# Patient Record
Sex: Male | Born: 1950 | Hispanic: No | Marital: Married | Smoking: Never smoker
Health system: Southern US, Community
[De-identification: ages and names within clinical notes are randomized; demographics above are authoritative.]

## PROBLEM LIST (undated history)

## (undated) DIAGNOSIS — E079 Disorder of thyroid, unspecified: Secondary | ICD-10-CM

## (undated) DIAGNOSIS — E119 Type 2 diabetes mellitus without complications: Secondary | ICD-10-CM

## (undated) DIAGNOSIS — I10 Essential (primary) hypertension: Secondary | ICD-10-CM

## (undated) HISTORY — PX: CHOLECYSTECTOMY: SHX55

---

## 2002-07-05 ENCOUNTER — Encounter: Payer: Self-pay | Admitting: Family Medicine

## 2002-07-05 ENCOUNTER — Encounter: Admission: RE | Admit: 2002-07-05 | Discharge: 2002-07-05 | Payer: Self-pay | Admitting: Family Medicine

## 2014-08-22 ENCOUNTER — Emergency Department (HOSPITAL_BASED_OUTPATIENT_CLINIC_OR_DEPARTMENT_OTHER)
Admission: EM | Admit: 2014-08-22 | Discharge: 2014-08-23 | Disposition: A | Discharge status: Home / Self Care | Payer: BC Managed Care – PPO | Attending: Emergency Medicine | Admitting: Emergency Medicine

## 2014-08-22 ENCOUNTER — Emergency Department (HOSPITAL_BASED_OUTPATIENT_CLINIC_OR_DEPARTMENT_OTHER): Payer: BC Managed Care – PPO

## 2014-08-22 ENCOUNTER — Encounter (HOSPITAL_BASED_OUTPATIENT_CLINIC_OR_DEPARTMENT_OTHER): Payer: Self-pay

## 2014-08-22 DIAGNOSIS — R0602 Shortness of breath: Secondary | ICD-10-CM

## 2014-08-22 DIAGNOSIS — Z791 Long term (current) use of non-steroidal anti-inflammatories (NSAID): Secondary | ICD-10-CM | POA: Diagnosis not present

## 2014-08-22 DIAGNOSIS — Z79899 Other long term (current) drug therapy: Secondary | ICD-10-CM | POA: Insufficient documentation

## 2014-08-22 DIAGNOSIS — E079 Disorder of thyroid, unspecified: Secondary | ICD-10-CM | POA: Insufficient documentation

## 2014-08-22 DIAGNOSIS — I10 Essential (primary) hypertension: Secondary | ICD-10-CM | POA: Insufficient documentation

## 2014-08-22 DIAGNOSIS — J209 Acute bronchitis, unspecified: Secondary | ICD-10-CM | POA: Diagnosis not present

## 2014-08-22 HISTORY — DX: Disorder of thyroid, unspecified: E07.9

## 2014-08-22 HISTORY — DX: Essential (primary) hypertension: I10

## 2014-08-22 LAB — CBC WITH DIFFERENTIAL/PLATELET
BASOS ABS: 0 10*3/uL (ref 0.0–0.1)
Basophils Relative: 1 % (ref 0–1)
Eosinophils Absolute: 0.4 10*3/uL (ref 0.0–0.7)
Eosinophils Relative: 6 % — ABNORMAL HIGH (ref 0–5)
HCT: 42.2 % (ref 39.0–52.0)
Hemoglobin: 13.9 g/dL (ref 13.0–17.0)
LYMPHS PCT: 28 % (ref 12–46)
Lymphs Abs: 1.6 10*3/uL (ref 0.7–4.0)
MCH: 32.3 pg (ref 26.0–34.0)
MCHC: 32.9 g/dL (ref 30.0–36.0)
MCV: 97.9 fL (ref 78.0–100.0)
Monocytes Absolute: 0.5 10*3/uL (ref 0.1–1.0)
Monocytes Relative: 9 % (ref 3–12)
NEUTROS ABS: 3.2 10*3/uL (ref 1.7–7.7)
NEUTROS PCT: 56 % (ref 43–77)
PLATELETS: 124 10*3/uL — AB (ref 150–400)
RBC: 4.31 MIL/uL (ref 4.22–5.81)
RDW: 13.3 % (ref 11.5–15.5)
WBC: 5.7 10*3/uL (ref 4.0–10.5)

## 2014-08-22 MED ORDER — ALBUTEROL SULFATE (2.5 MG/3ML) 0.083% IN NEBU
2.5000 mg | INHALATION_SOLUTION | Freq: Once | RESPIRATORY_TRACT | Status: AC
  Administered 2014-08-22: 2.5 mg via RESPIRATORY_TRACT
  Filled 2014-08-22: qty 3

## 2014-08-22 MED ORDER — IPRATROPIUM-ALBUTEROL 0.5-2.5 (3) MG/3ML IN SOLN
3.0000 mL | Freq: Once | RESPIRATORY_TRACT | Status: AC
  Administered 2014-08-22: 3 mL via RESPIRATORY_TRACT
  Filled 2014-08-22: qty 3

## 2014-08-22 NOTE — ED Provider Notes (Signed)
CSN: [id number]     Arrival date & time 08/22/14  2214 History  This chart was scribed for Geoffery Lyonsouglas Areyana Leoni, MD by Ronney LionSuzanne Le, ED Scribe. This patient was seen in room MH01/MH01 and the patient's care was started at 11:21 PM.    Chief Complaint  Patient presents with  . Shortness of Breath   Patient is a 63 y.o. male presenting with shortness of breath. The history is provided by the patient. No language interpreter was used.  Shortness of Breath Severity:  Moderate Duration:  3 hours Timing:  Constant Chronicity:  New Context: activity   Context comment:  Doing yard work. Relieved by:  Nothing Ineffective treatments:  None tried Associated symptoms: cough and sputum production (Occasional.)   Associated symptoms: no fever   Risk factors: obesity   Risk factors: no tobacco use     HPI Comments: Francisco Walker is a 63 y.o. male who presents to the Emergency Department complaining of SOB that began a few hours ago. He states that his SOB began a few hours today after doing yard work. He notes associated chest congestion accompanied by an occasional productive cough that he has had for 3 weeks. He reports a history of seasonal wheezing. Patient denies chest pain, pedal edema, fever, and abdominal pain. He denies a history of CHF, coronary stents, asthma, or arrhythmia. He denies a history of smoking. His last CXR was taken about 5 years ago and was normal.   Past Medical History  Diagnosis Date  . Hypertension   . Thyroid disease    Past Surgical History  Procedure Laterality Date  . Cholecystectomy     No family history on file. History  Substance Use Topics  . Smoking status: Never Smoker   . Smokeless tobacco: Not on file  . Alcohol Use: Yes    Review of Systems  Constitutional: Negative for fever.  HENT: Positive for congestion.   Respiratory: Positive for cough, sputum production (Occasional.) and shortness of breath.   Cardiovascular: Negative for leg swelling.   Neurological: Negative for syncope.  All other systems reviewed and are negative.     Allergies  Aspirin  Home Medications   Prior to Admission medications   Medication Sig Start Date End Date Taking? Authorizing Provider  carvedilol (COREG) 12.5 MG tablet Take 12.5 mg by mouth 2 (two) times daily with a meal.   Yes Historical Provider, MD  cyclobenzaprine (FLEXERIL) 10 MG tablet Take 10 mg by mouth 3 (three) times daily as needed for muscle spasms.   Yes Historical Provider, MD  gabapentin (NEURONTIN) 100 MG capsule Take 100 mg by mouth 3 (three) times daily.   Yes Historical Provider, MD  levothyroxine (SYNTHROID, LEVOTHROID) 112 MCG tablet Take 112 mcg by mouth daily before breakfast.   Yes Historical Provider, MD  losartan-hydrochlorothiazide (HYZAAR) 100-12.5 MG per tablet Take 1 tablet by mouth daily.   Yes Historical Provider, MD  meloxicam (MOBIC) 15 MG tablet Take 15 mg by mouth daily.   Yes Historical Provider, MD   BP 169/91 mmHg  Pulse 80  Temp(Src) 98 F (36.7 C) (Oral)  Resp 24  Ht 5\' 8"  (1.727 m)  Wt 327 lb (148.326 kg)  BMI 49.73 kg/m2  SpO2 94% Physical Exam  Constitutional: He is oriented to person, place, and time. He appears well-developed. No distress.  HENT:  Head: Normocephalic and atraumatic.  Eyes: Conjunctivae and EOM are normal.  Neck: Normal range of motion. Neck supple.  Cardiovascular: Normal rate and regular  rhythm.   Pulmonary/Chest: Effort normal. No stridor. No respiratory distress. He has wheezes. He has no rales.  There are expiratory wheezes bilaterally.   Abdominal: He exhibits no distension.  Musculoskeletal: He exhibits no edema.  Neurological: He is alert and oriented to person, place, and time.  Skin: Skin is warm and dry.  Psychiatric: He has a normal mood and affect.  Nursing note and vitals reviewed.   ED Course  Procedures (including critical care time)  DIAGNOSTIC STUDIES: Oxygen Saturation is 94% on room air, adequate  by my interpretation.    COORDINATION OF CARE: 11:25 PM-Discussed treatment plan which includes an inhaler, EKG, blood tests, with pt at bedside and pt agreed to plan.    Labs Review Labs Reviewed - No data to display  Imaging Review Dg Chest 2 View  08/22/2014   CLINICAL DATA:  Cough, wheezing, shortness of Breath  EXAM: CHEST  2 VIEW  COMPARISON:  None.  FINDINGS: Heart and mediastinal contours are within normal limits. No confluent airspace opacities or effusions. No acute bony abnormality.  IMPRESSION: No active cardiopulmonary disease.   Electronically Signed   By: Charlett NoseKevin  Dover M.D.   On: 08/22/2014 22:44     EKG Interpretation   Date/Time:  Wednesday August 22 2014 23:34:07 EST Ventricular Rate:  74 PR Interval:  184 QRS Duration: 92 QT Interval:  380 QTC Calculation: 421 R Axis:   -54 Text Interpretation:  Normal sinus rhythm Low voltage QRS Left anterior  fascicular block Nonspecific T wave abnormality Abnormal ECG Confirmed by  DELOS  MD, Leontae Bostock ([id number]) on 08/23/2014 12:22:21 AM      MDM   Final diagnoses:  SOB (shortness of breath)    Patient is a 63 year old male who presents with complaints of chest congestion for the past 3 weeks. It became worse today after he was doing yard work. On physical exam, he has expiratory wheezes bilaterally but is in no respiratory distress. His chest x-ray does not reveal a pneumonia but due to the persistence of his symptoms I will treat with antibiotic for presumed bronchitis. He will also receive prednisone and an inhaler and appears appropriate for discharge. There is no hypoxia or respiratory distress.  Workup does not suggest a cardiac etiology. His troponin is negative and EKG is not suggestive of an acute process. His BNP is also normal and there is no edema or heart failure on chest x-ray.  I personally performed the services described in this documentation, which was scribed in my presence. The recorded information has  been reviewed and is accurate.      Geoffery Lyonsouglas Jacalynn Buzzell, MD 08/23/14 [phone]

## 2014-08-22 NOTE — ED Notes (Signed)
Chest congestion x 3 weeks-SOB today after doing yard word

## 2014-08-22 NOTE — ED Notes (Signed)
MD at bedside. 

## 2014-08-23 DIAGNOSIS — J209 Acute bronchitis, unspecified: Secondary | ICD-10-CM | POA: Diagnosis not present

## 2014-08-23 LAB — COMPREHENSIVE METABOLIC PANEL
ALK PHOS: 65 U/L (ref 39–117)
ALT: 27 U/L (ref 0–53)
ANION GAP: 11 (ref 5–15)
AST: 28 U/L (ref 0–37)
Albumin: 3.7 g/dL (ref 3.5–5.2)
BILIRUBIN TOTAL: 0.4 mg/dL (ref 0.3–1.2)
BUN: 23 mg/dL (ref 6–23)
CHLORIDE: 101 meq/L (ref 96–112)
CO2: 29 mEq/L (ref 19–32)
CREATININE: 1.3 mg/dL (ref 0.50–1.35)
Calcium: 9.5 mg/dL (ref 8.4–10.5)
GFR, EST AFRICAN AMERICAN: 67 mL/min — AB (ref >90)
GFR, EST NON AFRICAN AMERICAN: 58 mL/min — AB (ref >90)
GLUCOSE: 144 mg/dL — AB (ref 70–99)
POTASSIUM: 4.8 meq/L (ref 3.7–5.3)
Sodium: 141 mEq/L (ref 137–147)
Total Protein: 7 g/dL (ref 6.0–8.3)

## 2014-08-23 LAB — PRO B NATRIURETIC PEPTIDE: Pro B Natriuretic peptide (BNP): 38.4 pg/mL (ref 0–125)

## 2014-08-23 LAB — TROPONIN I

## 2014-08-23 MED ORDER — AZITHROMYCIN 250 MG PO TABS
500.0000 mg | ORAL_TABLET | Freq: Once | ORAL | Status: AC
  Administered 2014-08-23: 500 mg via ORAL
  Filled 2014-08-23: qty 2

## 2014-08-23 MED ORDER — PREDNISONE 20 MG PO TABS
20.0000 mg | ORAL_TABLET | Freq: Once | ORAL | Status: AC
  Administered 2014-08-23: 20 mg via ORAL
  Filled 2014-08-23: qty 1

## 2014-08-23 MED ORDER — AZITHROMYCIN 250 MG PO TABS: 250.0000 mg | ORAL_TABLET | Freq: Every day | ORAL | Status: AC

## 2014-08-23 MED ORDER — PREDNISONE 10 MG PO TABS: 20.0000 mg | ORAL_TABLET | Freq: Two times a day (BID) | ORAL | Status: AC

## 2014-08-23 MED ORDER — ALBUTEROL SULFATE HFA 108 (90 BASE) MCG/ACT IN AERS
2.0000 | INHALATION_SPRAY | RESPIRATORY_TRACT | Status: DC | PRN
  Administered 2014-08-23: 2 via RESPIRATORY_TRACT
  Filled 2014-08-23: qty 6.7

## 2014-08-23 NOTE — Patient Instructions (Signed)
Instructed patient on the proper use of administering albuterol mdi via aerocamber patient tolerated well

## 2014-08-23 NOTE — Discharge Instructions (Signed)
Zithromax and prednisone as prescribed.  Albuterol inhaler: 2 puffs every 4 hours as needed for wheezing or congestion.  Return to the emergency department if you develop worsening difficulty breathing, chest pain, or other new and concerning symptoms.   Acute Bronchitis Bronchitis is inflammation of the airways that extend from the windpipe into the lungs (bronchi). The inflammation often causes mucus to develop. This leads to a cough, which is the most common symptom of bronchitis.  In acute bronchitis, the condition usually develops suddenly and goes away over time, usually in a couple weeks. Smoking, allergies, and asthma can make bronchitis worse. Repeated episodes of bronchitis may cause further lung problems.  CAUSES Acute bronchitis is most often caused by the same virus that causes a cold. The virus can spread from person to person (contagious) through coughing, sneezing, and touching contaminated objects. SIGNS AND SYMPTOMS   Cough.   Fever.   Coughing up mucus.   Body aches.   Chest congestion.   Chills.   Shortness of breath.   Sore throat.  DIAGNOSIS  Acute bronchitis is usually diagnosed through a physical exam. Your health care provider will also ask you questions about your medical history. Tests, such as chest X-rays, are sometimes done to rule out other conditions.  TREATMENT  Acute bronchitis usually goes away in a couple weeks. Oftentimes, no medical treatment is necessary. Medicines are sometimes given for relief of fever or cough. Antibiotic medicines are usually not needed but may be prescribed in certain situations. In some cases, an inhaler may be recommended to help reduce shortness of breath and control the cough. A cool mist vaporizer may also be used to help thin bronchial secretions and make it easier to clear the chest.  HOME CARE INSTRUCTIONS  Get plenty of rest.   Drink enough fluids to keep your urine clear or pale yellow (unless you  have a medical condition that requires fluid restriction). Increasing fluids may help thin your respiratory secretions (sputum) and reduce chest congestion, and it will prevent dehydration.   Take medicines only as directed by your health care provider.  If you were prescribed an antibiotic medicine, finish it all even if you start to feel better.  Avoid smoking and secondhand smoke. Exposure to cigarette smoke or irritating chemicals will make bronchitis worse. If you are a smoker, consider using nicotine gum or skin patches to help control withdrawal symptoms. Quitting smoking will help your lungs heal faster.   Reduce the chances of another bout of acute bronchitis by washing your hands frequently, avoiding people with cold symptoms, and trying not to touch your hands to your mouth, nose, or eyes.   Keep all follow-up visits as directed by your health care provider.  SEEK MEDICAL CARE IF: Your symptoms do not improve after 1 week of treatment.  SEEK IMMEDIATE MEDICAL CARE IF:  You develop an increased fever or chills.   You have chest pain.   You have severe shortness of breath.  You have bloody sputum.   You develop dehydration.  You faint or repeatedly feel like you are going to pass out.  You develop repeated vomiting.  You develop a severe headache. MAKE SURE YOU:   Understand these instructions.  Will watch your condition.  Will get help right away if you are not doing well or get worse. Document Released: 11/05/2004 Document Revised: 02/12/2014 Document Reviewed: 03/21/2013 [facility] Patient Information 2015 [place], [place]. This information is not intended to replace advice given to you by  your health care provider. Make sure you discuss any questions you have with your health care provider. ° °

## 2015-02-27 ENCOUNTER — Encounter (HOSPITAL_BASED_OUTPATIENT_CLINIC_OR_DEPARTMENT_OTHER): Payer: Self-pay

## 2015-02-27 ENCOUNTER — Emergency Department (HOSPITAL_BASED_OUTPATIENT_CLINIC_OR_DEPARTMENT_OTHER)
Admission: EM | Admit: 2015-02-27 | Discharge: 2015-02-27 | Disposition: A | Discharge status: Home / Self Care | Payer: Worker's Compensation | Attending: Emergency Medicine | Admitting: Emergency Medicine

## 2015-02-27 ENCOUNTER — Emergency Department (HOSPITAL_BASED_OUTPATIENT_CLINIC_OR_DEPARTMENT_OTHER): Payer: Worker's Compensation

## 2015-02-27 DIAGNOSIS — E079 Disorder of thyroid, unspecified: Secondary | ICD-10-CM | POA: Insufficient documentation

## 2015-02-27 DIAGNOSIS — Z7952 Long term (current) use of systemic steroids: Secondary | ICD-10-CM | POA: Diagnosis not present

## 2015-02-27 DIAGNOSIS — M25561 Pain in right knee: Secondary | ICD-10-CM

## 2015-02-27 DIAGNOSIS — Y990 Civilian activity done for income or pay: Secondary | ICD-10-CM | POA: Insufficient documentation

## 2015-02-27 DIAGNOSIS — E119 Type 2 diabetes mellitus without complications: Secondary | ICD-10-CM | POA: Insufficient documentation

## 2015-02-27 DIAGNOSIS — I10 Essential (primary) hypertension: Secondary | ICD-10-CM | POA: Diagnosis not present

## 2015-02-27 DIAGNOSIS — Y9301 Activity, walking, marching and hiking: Secondary | ICD-10-CM | POA: Insufficient documentation

## 2015-02-27 DIAGNOSIS — Z792 Long term (current) use of antibiotics: Secondary | ICD-10-CM | POA: Insufficient documentation

## 2015-02-27 DIAGNOSIS — X58XXXA Exposure to other specified factors, initial encounter: Secondary | ICD-10-CM | POA: Diagnosis not present

## 2015-02-27 DIAGNOSIS — Z791 Long term (current) use of non-steroidal anti-inflammatories (NSAID): Secondary | ICD-10-CM | POA: Diagnosis not present

## 2015-02-27 DIAGNOSIS — Y9289 Other specified places as the place of occurrence of the external cause: Secondary | ICD-10-CM | POA: Insufficient documentation

## 2015-02-27 DIAGNOSIS — Z79899 Other long term (current) drug therapy: Secondary | ICD-10-CM | POA: Insufficient documentation

## 2015-02-27 DIAGNOSIS — S8991XA Unspecified injury of right lower leg, initial encounter: Secondary | ICD-10-CM | POA: Diagnosis not present

## 2015-02-27 HISTORY — DX: Type 2 diabetes mellitus without complications: E11.9

## 2015-02-27 MED ORDER — HYDROCODONE-ACETAMINOPHEN 5-325 MG PO TABS: 1.0000 | ORAL_TABLET | Freq: Four times a day (QID) | ORAL | Status: AC | PRN

## 2015-02-27 NOTE — ED Provider Notes (Signed)
CSN: [id number]     Arrival date & time 02/27/15  [id number] History   First MD Initiated Contact with Patient 02/27/15 [phone]     Chief Complaint  Patient presents with  . Knee Pain     (Consider location/radiation/quality/duration/timing/severity/associated sxs/prior Treatment) HPI Comments: Patient is a 64 year old male with history of hypertension, diabetes, and obesity. He presents for evaluation of right knee pain. He states he was walking up the stairs yesterday evening at work when he felt a pop. Since then he has had significant discomfort with weightbearing and ambulation. He denies any prior history of knee problems. He has had no prior knee surgeries.  Patient is a [age] y.o. male presenting with knee pain. The history is provided by the patient.  Knee Pain Location:  Knee Injury: yes   Knee location:  R knee Pain details:    Quality:  Sharp   Radiates to:  Does not radiate   Severity:  Moderate   Onset quality:  Sudden   Duration:  1 day   Timing:  Constant   Progression:  Worsening Chronicity:  New Prior injury to area:  No Relieved by:  Rest Worsened by:  Activity and bearing weight Ineffective treatments:  None tried   Past Medical History  Diagnosis Date  . Hypertension   . Thyroid disease   . Diabetes mellitus without complication    Past Surgical History  Procedure Laterality Date  . Cholecystectomy     No family history on file. History  Substance Use Topics  . Smoking status: Never Smoker   . Smokeless tobacco: Not on file  . Alcohol Use: Yes    Review of Systems  All other systems reviewed and are negative.     Allergies  Aspirin  Home Medications   Prior to Admission medications   Medication Sig Start Date End Date Taking? Authorizing Provider  azithromycin (ZITHROMAX) 250 MG tablet Take 1 tablet (250 mg total) by mouth daily. 08/23/14   Geoffery Lyonsouglas Gloristine Turrubiates, MD  carvedilol (COREG) 12.5 MG tablet Take 12.5 mg by mouth 2 (two) times daily with a  meal.    Historical Provider, MD  cyclobenzaprine (FLEXERIL) 10 MG tablet Take 10 mg by mouth 3 (three) times daily as needed for muscle spasms.    Historical Provider, MD  gabapentin (NEURONTIN) 100 MG capsule Take 100 mg by mouth 3 (three) times daily.    Historical Provider, MD  levothyroxine (SYNTHROID, LEVOTHROID) 112 MCG tablet Take 112 mcg by mouth daily before breakfast.    Historical Provider, MD  losartan-hydrochlorothiazide (HYZAAR) 100-12.5 MG per tablet Take 1 tablet by mouth daily.    Historical Provider, MD  meloxicam (MOBIC) 15 MG tablet Take 15 mg by mouth daily.    Historical Provider, MD  predniSONE (DELTASONE) 10 MG tablet Take 2 tablets (20 mg total) by mouth 2 (two) times daily. 08/23/14   Geoffery Lyonsouglas Sequoyah Counterman, MD   BP 145/76 mmHg  Pulse 66  Temp(Src) 98.3 F (36.8 C) (Oral)  Resp 22  Ht 5\' 8"  (1.727 m)  Wt 310 lb (140.615 kg)  BMI 47.15 kg/m2  SpO2 98% Physical Exam  Constitutional: He is oriented to person, place, and time. He appears well-developed and well-nourished. No distress.  HENT:  Head: Normocephalic and atraumatic.  Neck: Normal range of motion. Neck supple.  Musculoskeletal:  The right knee appears grossly normal. There is no palpable effusion. He has good range of motion without crepitus. Anterior and posterior drawer tests are negative. There is no  laxity with varus or valgus stress.  Neurological: He is alert and oriented to person, place, and time.  Skin: Skin is warm and dry. He is not diaphoretic.  Nursing note and vitals reviewed.   ED Course  Procedures (including critical care time) Labs Review Labs Reviewed - No data to display  Imaging Review Dg Knee Complete 4 Views Right  02/27/2015   CLINICAL DATA:  Popping sensation in right knee, anterior pain, initial encounter.  EXAM: RIGHT KNEE - COMPLETE 4+ VIEW  COMPARISON:  None.  FINDINGS: No joint effusion or fracture. Minimal patellofemoral osteophytosis and subchondral sclerosis.  IMPRESSION: 1.  No acute findings. 2. Patellofemoral compartment osteoarthritis.   Electronically Signed   By: Leanna BattlesMelinda  Blietz M.D.   On: 02/27/2015 07:17     EKG Interpretation None      MDM   Final diagnoses:  None    X-rays are negative for acute process and physical examination reveals no significant laxity or instability. His x-rays do show arthritic changes. He will be treated with anti-inflammatories, pain medication, immobilization and crutches, and when necessary follow-up with his doctor in one week if not improving.    Geoffery Lyonsouglas Georgianne Gritz, MD 02/27/15 [phone]

## 2015-02-27 NOTE — Discharge Instructions (Signed)
Wear knee immobilizer and use crutches for the next several days.  Elevate your leg whenever possible, and ice for 20 minutes every 3 hours for the next several days.  Follow-up with your doctor if not improving in 1 week to discuss physical therapy or further imaging.   Knee Pain The knee is the complex joint between your thigh and your lower leg. It is made up of bones, tendons, ligaments, and cartilage. The bones that make up the knee are:  The femur in the thigh.  The tibia and fibula in the lower leg.  The patella or kneecap riding in the groove on the lower femur. CAUSES  Knee pain is a common complaint with many causes. A few of these causes are:  Injury, such as:  A ruptured ligament or tendon injury.  Torn cartilage.  Medical conditions, such as:  Gout  Arthritis  Infections  Overuse, over training, or overdoing a physical activity. Knee pain can be minor or severe. Knee pain can accompany debilitating injury. Minor knee problems often respond well to self-care measures or get well on their own. More serious injuries may need medical intervention or even surgery. SYMPTOMS The knee is complex. Symptoms of knee problems can vary widely. Some of the problems are:  Pain with movement and weight bearing.  Swelling and tenderness.  Buckling of the knee.  Inability to straighten or extend your knee.  Your knee locks and you cannot straighten it.  Warmth and redness with pain and fever.  Deformity or dislocation of the kneecap. DIAGNOSIS  Determining what is wrong may be very straight forward such as when there is an injury. It can also be challenging because of the complexity of the knee. Tests to make a diagnosis may include:  Your caregiver taking a history and doing a physical exam.  Routine X-rays can be used to rule out other problems. X-rays will not reveal a cartilage tear. Some injuries of the knee can be diagnosed by:  Arthroscopy a surgical  technique by which a small video camera is inserted through tiny incisions on the sides of the knee. This procedure is used to examine and repair internal knee joint problems. Tiny instruments can be used during arthroscopy to repair the torn knee cartilage (meniscus).  Arthrography is a radiology technique. A contrast liquid is directly injected into the knee joint. Internal structures of the knee joint then become visible on X-ray film.  An MRI scan is a non X-ray radiology procedure in which magnetic fields and a computer produce two- or three-dimensional images of the inside of the knee. Cartilage tears are often visible using an MRI scanner. MRI scans have largely replaced arthrography in diagnosing cartilage tears of the knee.  Blood work.  Examination of the fluid that helps to lubricate the knee joint (synovial fluid). This is done by taking a sample out using a needle and a syringe. TREATMENT The treatment of knee problems depends on the cause. Some of these treatments are:  Depending on the injury, proper casting, splinting, surgery, or physical therapy care will be needed.  Give yourself adequate recovery time. Do not overuse your joints. If you begin to get sore during workout routines, back off. Slow down or do fewer repetitions.  For repetitive activities such as cycling or running, maintain your strength and nutrition.  Alternate muscle groups. For example, if you are a weight lifter, work the upper body on one day and the lower body the next.  Either tight or  weak muscles do not give the proper support for your knee. Tight or weak muscles do not absorb the stress placed on the knee joint. Keep the muscles surrounding the knee strong.  Take care of mechanical problems.  If you have flat feet, orthotics or special shoes may help. See your caregiver if you need help.  Arch supports, sometimes with wedges on the inner or outer aspect of the heel, can help. These can shift  pressure away from the side of the knee most bothered by osteoarthritis.  A brace called an "unloader" brace also may be used to help ease the pressure on the most arthritic side of the knee.  If your caregiver has prescribed crutches, braces, wraps or ice, use as directed. The acronym for this is PRICE. This means protection, rest, ice, compression, and elevation.  Nonsteroidal anti-inflammatory drugs (NSAIDs), can help relieve pain. But if taken immediately after an injury, they may actually increase swelling. Take NSAIDs with food in your stomach. Stop them if you develop stomach problems. Do not take these if you have a history of ulcers, stomach pain, or bleeding from the bowel. Do not take without your caregiver's approval if you have problems with fluid retention, heart failure, or kidney problems.  For ongoing knee problems, physical therapy may be helpful.  Glucosamine and chondroitin are over-the-counter dietary supplements. Both may help relieve the pain of osteoarthritis in the knee. These medicines are different from the usual anti-inflammatory drugs. Glucosamine may decrease the rate of cartilage destruction.  Injections of a corticosteroid drug into your knee joint may help reduce the symptoms of an arthritis flare-up. They may provide pain relief that lasts a few months. You may have to wait a few months between injections. The injections do have a small increased risk of infection, water retention, and elevated blood sugar levels.  Hyaluronic acid injected into damaged joints may ease pain and provide lubrication. These injections may work by reducing inflammation. A series of shots may give relief for as long as 6 months.  Topical painkillers. Applying certain ointments to your skin may help relieve the pain and stiffness of osteoarthritis. Ask your pharmacist for suggestions. Many over the-counter products are approved for temporary relief of arthritis pain.  In some countries,  doctors often prescribe topical NSAIDs for relief of chronic conditions such as arthritis and tendinitis. A review of treatment with NSAID creams found that they worked as well as oral medications but without the serious side effects. PREVENTION  Maintain a healthy weight. Extra pounds put more strain on your joints.  Get strong, stay limber. Weak muscles are a common cause of knee injuries. Stretching is important. Include flexibility exercises in your workouts.  Be smart about exercise. If you have osteoarthritis, chronic knee pain or recurring injuries, you may need to change the way you exercise. This does not mean you have to stop being active. If your knees ache after jogging or playing basketball, consider switching to swimming, water aerobics, or other low-impact activities, at least for a few days a week. Sometimes limiting high-impact activities will provide relief.  Make sure your shoes fit well. Choose footwear that is right for your sport.  Protect your knees. Use the proper gear for knee-sensitive activities. Use kneepads when playing volleyball or laying carpet. Buckle your seat belt every time you drive. Most shattered kneecaps occur in car accidents.  Rest when you are tired. SEEK MEDICAL CARE IF:  You have knee pain that is continual and does  not seem to be getting better.  SEEK IMMEDIATE MEDICAL CARE IF:  Your knee joint feels hot to the touch and you have a high fever. MAKE SURE YOU:   Understand these instructions.  Will watch your condition.  Will get help right away if you are not doing well or get worse. Document Released: 07/26/2007 Document Revised: 12/21/2011 Document Reviewed: 07/26/2007 [facility] Patient Information 2015 [place], [place]. This information is not intended to replace advice given to you by your health care provider. Make sure you discuss any questions you have with your health care provider.

## 2015-02-27 NOTE — ED Notes (Signed)
Pt states at work last night walking up the steps and felt his rt knee pop; denies pain at this time

## 2016-04-01 IMAGING — DX DG KNEE COMPLETE 4+V*R*
4 series · 4 of 4 positions shown · non-contrast
Comparison: None.

CLINICAL DATA: Popping sensation in right knee, anterior pain,
initial encounter.

EXAM:
RIGHT KNEE - COMPLETE 4+ VIEW

[knee ap]
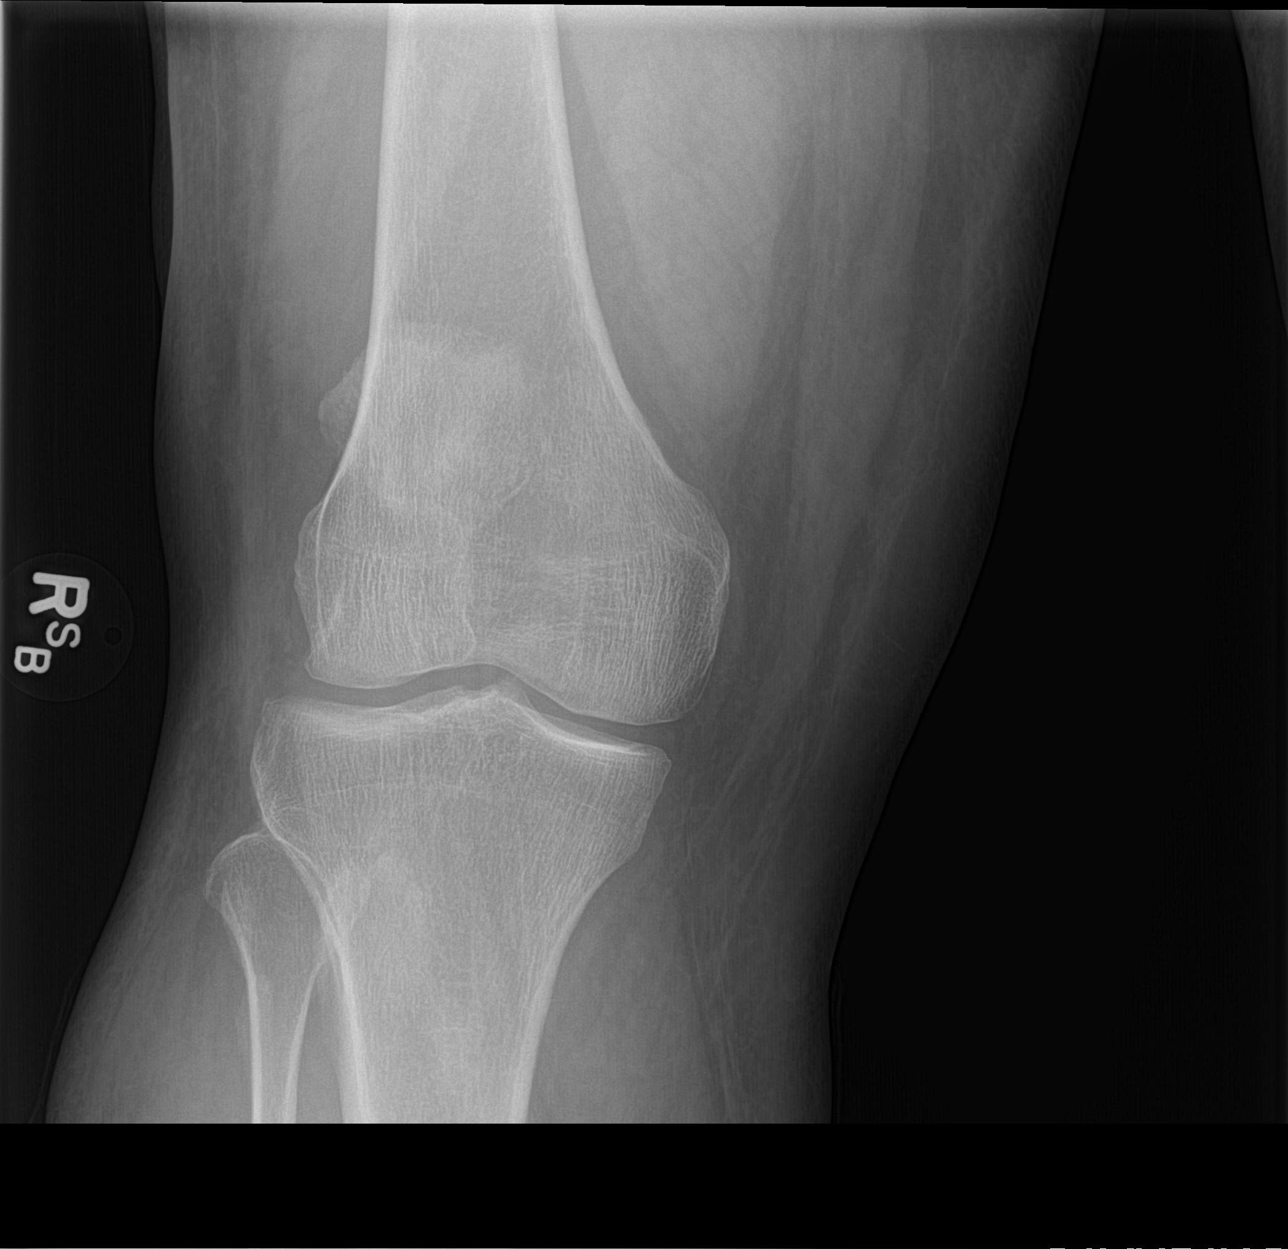

[tunnel]
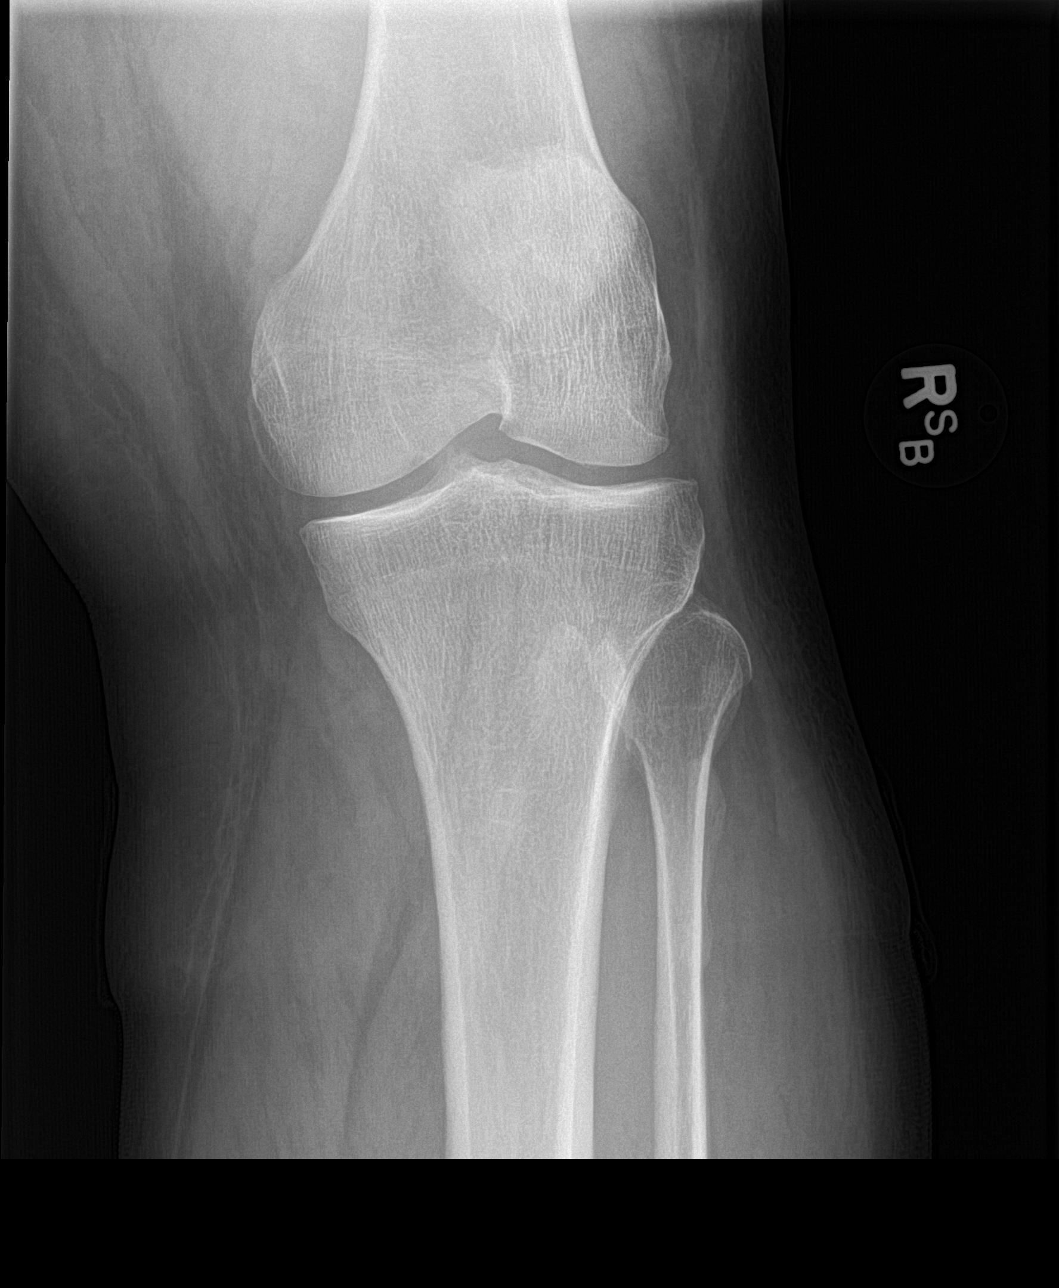

[knee lat]
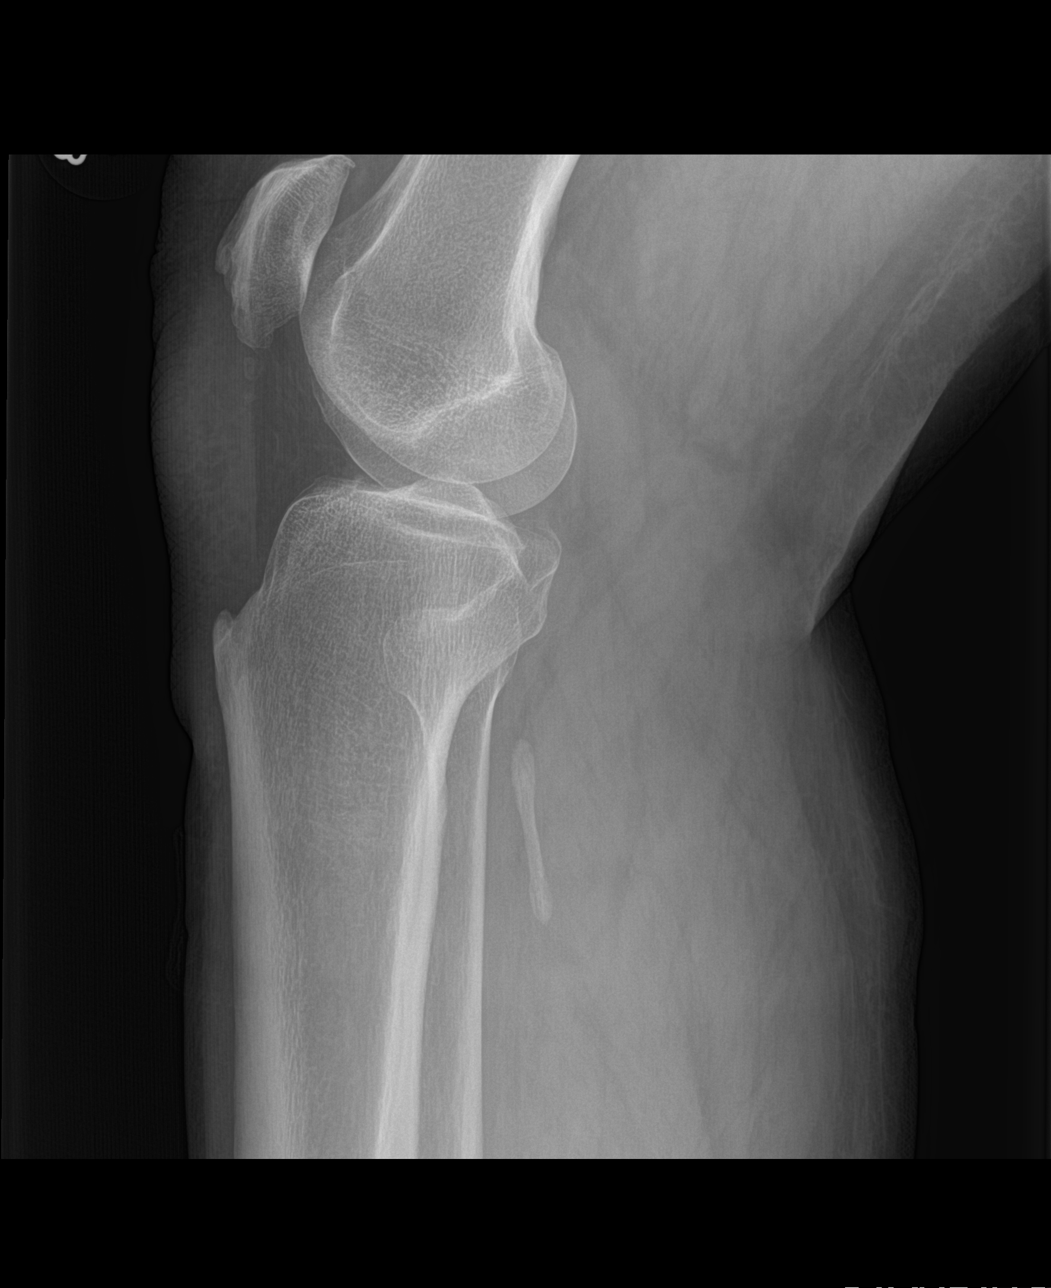

[knee sunrise]
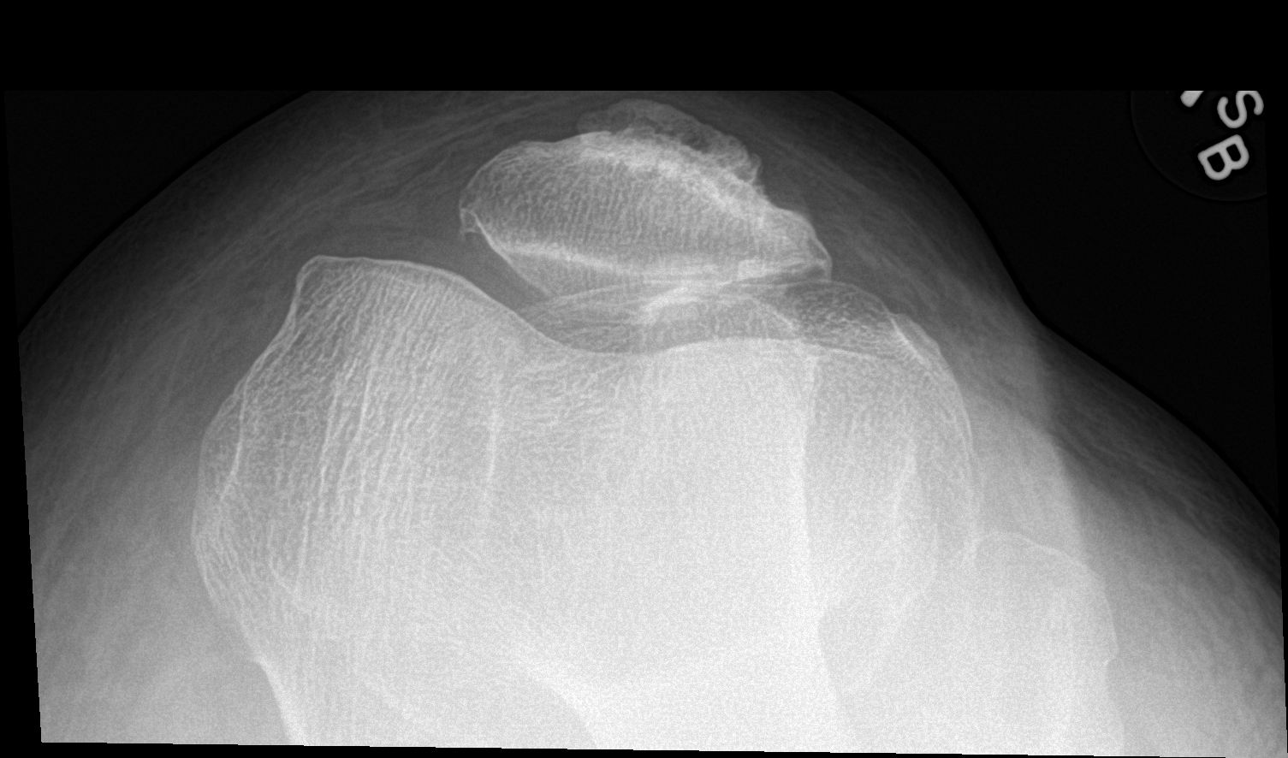

[4 of 4 positions shown; findings below may reference images not displayed]

FINDINGS: No joint effusion or fracture. Minimal patellofemoral osteophytosis
and subchondral sclerosis.
IMPRESSION: 1. No acute findings.
2. Patellofemoral compartment osteoarthritis.
